# Patient Record
Sex: Male | Born: 1985 | Race: White | Hispanic: No | Marital: Single | Smoking: Never smoker
Health system: Southern US, Community
[De-identification: ages and names within clinical notes are randomized; demographics above are authoritative.]

## PROBLEM LIST (undated history)

## (undated) DIAGNOSIS — S42309A Unspecified fracture of shaft of humerus, unspecified arm, initial encounter for closed fracture: Secondary | ICD-10-CM

## (undated) HISTORY — PX: ORIF ANKLE FRACTURE: SUR919

## (undated) HISTORY — PX: ANKLE SURGERY: SHX546

## (undated) HISTORY — PX: FACIAL FRACTURE SURGERY: SHX1570

## (undated) HISTORY — PX: FOREARM FRACTURE SURGERY: SHX649

## (undated) HISTORY — PX: HAND SURGERY: SHX662

---

## 2006-06-10 ENCOUNTER — Emergency Department (HOSPITAL_COMMUNITY): Admission: EM | Admit: 2006-06-10 | Discharge: 2006-06-10 | Payer: Self-pay | Admitting: Emergency Medicine

## 2007-09-07 ENCOUNTER — Emergency Department (HOSPITAL_COMMUNITY): Admission: EM | Admit: 2007-09-07 | Discharge: 2007-09-08 | Payer: Self-pay | Admitting: Emergency Medicine

## 2010-11-14 ENCOUNTER — Ambulatory Visit (HOSPITAL_COMMUNITY)
Admission: RE | Admit: 2010-11-14 | Discharge: 2010-11-14 | Disposition: A | Discharge status: Home / Self Care | Payer: Private Health Insurance - Indemnity | Source: Ambulatory Visit | Attending: Orthopedic Surgery | Admitting: Orthopedic Surgery

## 2010-11-14 ENCOUNTER — Other Ambulatory Visit (HOSPITAL_COMMUNITY): Payer: Self-pay | Admitting: Orthopedic Surgery

## 2010-11-14 DIAGNOSIS — S2239XA Fracture of one rib, unspecified side, initial encounter for closed fracture: Secondary | ICD-10-CM | POA: Insufficient documentation

## 2010-11-14 DIAGNOSIS — T148XXA Other injury of unspecified body region, initial encounter: Secondary | ICD-10-CM

## 2010-11-14 DIAGNOSIS — S42023A Displaced fracture of shaft of unspecified clavicle, initial encounter for closed fracture: Secondary | ICD-10-CM | POA: Insufficient documentation

## 2010-11-14 DIAGNOSIS — X58XXXA Exposure to other specified factors, initial encounter: Secondary | ICD-10-CM | POA: Insufficient documentation

## 2010-11-14 DIAGNOSIS — R0789 Other chest pain: Secondary | ICD-10-CM | POA: Insufficient documentation

## 2010-11-24 ENCOUNTER — Inpatient Hospital Stay (HOSPITAL_COMMUNITY): Payer: Managed Care, Other (non HMO)

## 2010-11-24 ENCOUNTER — Inpatient Hospital Stay (HOSPITAL_COMMUNITY)
Admission: AD | Admit: 2010-11-24 | Discharge: 2010-11-25 | DRG: 551 | Disposition: A | Discharge status: Home / Self Care | Payer: Managed Care, Other (non HMO) | Source: Ambulatory Visit | Attending: Internal Medicine | Admitting: Internal Medicine

## 2010-11-24 DIAGNOSIS — M5020 Other cervical disc displacement, unspecified cervical region: Principal | ICD-10-CM | POA: Diagnosis present

## 2010-11-24 DIAGNOSIS — Z885 Allergy status to narcotic agent status: Secondary | ICD-10-CM

## 2010-11-24 DIAGNOSIS — I10 Essential (primary) hypertension: Secondary | ICD-10-CM | POA: Diagnosis present

## 2010-11-24 DIAGNOSIS — IMO0001 Reserved for inherently not codable concepts without codable children: Secondary | ICD-10-CM

## 2010-11-24 DIAGNOSIS — J189 Pneumonia, unspecified organism: Secondary | ICD-10-CM | POA: Diagnosis present

## 2010-11-24 LAB — CARDIAC PANEL(CRET KIN+CKTOT+MB+TROPI)
Relative Index: 0.8 (ref 0.0–2.5)
Troponin I: <0.3 ng/mL (ref <0.30)
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
## 2013-02-09 ENCOUNTER — Emergency Department (HOSPITAL_BASED_OUTPATIENT_CLINIC_OR_DEPARTMENT_OTHER): Payer: Managed Care, Other (non HMO)

## 2013-02-09 ENCOUNTER — Emergency Department (HOSPITAL_BASED_OUTPATIENT_CLINIC_OR_DEPARTMENT_OTHER)
Admission: EM | Admit: 2013-02-09 | Discharge: 2013-02-10 | Disposition: A | Discharge status: Home / Self Care | Payer: Managed Care, Other (non HMO) | Attending: Emergency Medicine | Admitting: Emergency Medicine

## 2013-02-09 ENCOUNTER — Encounter (HOSPITAL_BASED_OUTPATIENT_CLINIC_OR_DEPARTMENT_OTHER): Payer: Self-pay | Admitting: Emergency Medicine

## 2013-02-09 DIAGNOSIS — S52209A Unspecified fracture of shaft of unspecified ulna, initial encounter for closed fracture: Secondary | ICD-10-CM | POA: Insufficient documentation

## 2013-02-09 DIAGNOSIS — R296 Repeated falls: Secondary | ICD-10-CM | POA: Insufficient documentation

## 2013-02-09 DIAGNOSIS — Z8781 Personal history of (healed) traumatic fracture: Secondary | ICD-10-CM | POA: Insufficient documentation

## 2013-02-09 DIAGNOSIS — S52202A Unspecified fracture of shaft of left ulna, initial encounter for closed fracture: Secondary | ICD-10-CM

## 2013-02-09 DIAGNOSIS — Y9389 Activity, other specified: Secondary | ICD-10-CM | POA: Insufficient documentation

## 2013-02-09 DIAGNOSIS — Y9289 Other specified places as the place of occurrence of the external cause: Secondary | ICD-10-CM | POA: Insufficient documentation

## 2013-02-09 HISTORY — DX: Unspecified fracture of shaft of humerus, unspecified arm, initial encounter for closed fracture: S42.309A

## 2013-02-09 MED ORDER — IBUPROFEN 800 MG PO TABS
800.0000 mg | ORAL_TABLET | Freq: Once | ORAL | Status: AC
  Administered 2013-02-09: 800 mg via ORAL
  Filled 2013-02-09: qty 1

## 2013-02-09 MED ORDER — OXYCODONE-ACETAMINOPHEN 5-325 MG PO TABS
1.0000 | ORAL_TABLET | Freq: Once | ORAL | Status: AC
  Administered 2013-02-09: 1 via ORAL
  Filled 2013-02-09 (×2): qty 1

## 2013-02-09 NOTE — ED Notes (Signed)
I began splinting procedure per Dr. Nicanor Alcon request. Patient is hesitant to let me proceed due to pain and inability to straighten fingers. Dr. Algis Downs me to let patient medication take effect before proceeding.

## 2013-02-09 NOTE — ED Notes (Signed)
Pt fell on left arm.  Pt c/o left forearm and wrist pain.  Pulses palpable.
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
## 2013-02-10 ENCOUNTER — Encounter (HOSPITAL_BASED_OUTPATIENT_CLINIC_OR_DEPARTMENT_OTHER)
Admission: EM | Disposition: A | Discharge status: Home / Self Care | Payer: Self-pay | Source: Home / Self Care | Attending: Emergency Medicine

## 2013-02-10 ENCOUNTER — Encounter (HOSPITAL_COMMUNITY): Payer: Self-pay | Admitting: *Deleted

## 2013-02-10 ENCOUNTER — Emergency Department (HOSPITAL_COMMUNITY)
Admission: EM | Admit: 2013-02-10 | Discharge: 2013-02-10 | Disposition: A | Discharge status: Home / Self Care | Payer: Managed Care, Other (non HMO) | Source: Home / Self Care

## 2013-02-10 DIAGNOSIS — S63006A Unspecified dislocation of unspecified wrist and hand, initial encounter: Secondary | ICD-10-CM | POA: Insufficient documentation

## 2013-02-10 DIAGNOSIS — X58XXXA Exposure to other specified factors, initial encounter: Secondary | ICD-10-CM | POA: Insufficient documentation

## 2013-02-10 DIAGNOSIS — Z8781 Personal history of (healed) traumatic fracture: Secondary | ICD-10-CM | POA: Insufficient documentation

## 2013-02-10 DIAGNOSIS — Y929 Unspecified place or not applicable: Secondary | ICD-10-CM | POA: Insufficient documentation

## 2013-02-10 DIAGNOSIS — Z9889 Other specified postprocedural states: Secondary | ICD-10-CM | POA: Insufficient documentation

## 2013-02-10 DIAGNOSIS — Y939 Activity, unspecified: Secondary | ICD-10-CM | POA: Insufficient documentation

## 2013-02-10 SURGERY — CANCELLED PROCEDURE
Laterality: Left

## 2013-02-10 MED ORDER — CLINDAMYCIN PHOSPHATE 600 MG/50ML IV SOLN: 600.0000 mg | Freq: Once | INTRAVENOUS | Status: AC

## 2013-02-10 MED ORDER — OXYCODONE-ACETAMINOPHEN 5-325 MG PO TABS: 1.0000 | ORAL_TABLET | Freq: Four times a day (QID) | ORAL | Status: DC | PRN

## 2013-02-10 MED ORDER — IBUPROFEN 800 MG PO TABS: 800.0000 mg | ORAL_TABLET | Freq: Three times a day (TID) | ORAL | Status: DC

## 2013-02-10 SURGICAL SUPPLY — 38 items
BANDAGE ELASTIC 3 VELCRO ST LF (GAUZE/BANDAGES/DRESSINGS) ×3 IMPLANT
BANDAGE ELASTIC 4 VELCRO ST LF (GAUZE/BANDAGES/DRESSINGS) ×3 IMPLANT
BANDAGE GAUZE ELAST BULKY 4 IN (GAUZE/BANDAGES/DRESSINGS) ×3 IMPLANT
BLADE SURG ROTATE 9660 (MISCELLANEOUS) IMPLANT
BNDG CMPR 9X4 STRL LF SNTH (GAUZE/BANDAGES/DRESSINGS) ×2
BNDG ESMARK 4X9 LF (GAUZE/BANDAGES/DRESSINGS) ×3 IMPLANT
CLOTH BEACON ORANGE TIMEOUT ST (SAFETY) ×3 IMPLANT
CORDS BIPOLAR (ELECTRODE) ×3 IMPLANT
COVER SURGICAL LIGHT HANDLE (MISCELLANEOUS) ×3 IMPLANT
CUFF TOURNIQUET SINGLE 18IN (TOURNIQUET CUFF) ×3 IMPLANT
CUFF TOURNIQUET SINGLE 24IN (TOURNIQUET CUFF) IMPLANT
DECANTER SPIKE VIAL GLASS SM (MISCELLANEOUS) IMPLANT
DRAPE OEC MINIVIEW 54X84 (DRAPES) IMPLANT
DRAPE U-SHAPE 47X51 STRL (DRAPES) ×3 IMPLANT
GAUZE XEROFORM 1X8 LF (GAUZE/BANDAGES/DRESSINGS) ×3 IMPLANT
GLOVE ORTHO TXT STRL SZ7.5 (GLOVE) ×3 IMPLANT
GLOVE SS BIOGEL STRL SZ 8 (GLOVE) ×2 IMPLANT
GLOVE SUPERSENSE BIOGEL SZ 8 (GLOVE) ×1
GOWN PREVENTION PLUS XLARGE (GOWN DISPOSABLE) ×3 IMPLANT
GOWN STRL NON-REIN LRG LVL3 (GOWN DISPOSABLE) ×9 IMPLANT
GOWN STRL REIN XL XLG (GOWN DISPOSABLE) ×6 IMPLANT
KIT BASIN OR (CUSTOM PROCEDURE TRAY) ×3 IMPLANT
KIT ROOM TURNOVER OR (KITS) ×3 IMPLANT
LOOP VESSEL MAXI BLUE (MISCELLANEOUS) IMPLANT
MANIFOLD NEPTUNE II (INSTRUMENTS) ×3 IMPLANT
NEEDLE 22X1 1/2 (OR ONLY) (NEEDLE) IMPLANT
NS IRRIG 1000ML POUR BTL (IV SOLUTION) ×3 IMPLANT
PACK ORTHO EXTREMITY (CUSTOM PROCEDURE TRAY) ×3 IMPLANT
PAD ARMBOARD 7.5X6 YLW CONV (MISCELLANEOUS) ×6 IMPLANT
PAD CAST 4YDX4 CTTN HI CHSV (CAST SUPPLIES) ×2 IMPLANT
PADDING CAST COTTON 4X4 STRL (CAST SUPPLIES) ×2
SYR CONTROL 10ML LL (SYRINGE) IMPLANT
TOWEL OR 17X24 6PK STRL BLUE (TOWEL DISPOSABLE) ×3 IMPLANT
TOWEL OR 17X26 10 PK STRL BLUE (TOWEL DISPOSABLE) ×3 IMPLANT
TUBE CONNECTING 12X1/4 (SUCTIONS) ×3 IMPLANT
TUBE EVACUATION TLS (MISCELLANEOUS) ×3 IMPLANT
UNDERPAD 30X30 INCONTINENT (UNDERPADS AND DIAPERS) ×3 IMPLANT
WATER STERILE IRR 1000ML POUR (IV SOLUTION) ×3 IMPLANT

## 2013-02-10 NOTE — ED Notes (Signed)
I completed volar type splint with fiberglass material. Per Dr. Nicanor Alcon, I kept splint material to ulnar aspect of hand and forearm and helped patient slightly move clinched fingers to a more natural cusp. I first applied sock material and padded hand, wrist, and forearm with cotton webril.,I then applied fiberglass, and covered with light wrap of kerlix, then secured all with Ace. I applied and adjusted arm sling and checked capillary refill before and after procedure. Patient demonstrated independent finger movement and stated less pain, that he feels much better.

## 2013-02-10 NOTE — ED Notes (Addendum)
OR MD Gramig number [phone]

## 2013-02-10 NOTE — ED Notes (Signed)
Arm remains in splint and sling. Splint and sling CDI. Cap refill <2sec. Fingers pink and warm. Able to move fingers. Denies pain. Father at [facility]. Pt reports "ready to go", "will f/u with my ortho had surgeon that I have used in the past, Dr. Mina Marble", alert, NAD, calm, interactive, resps e/u, speaking in clear complete sentences. Ambulatory to d/c desk with father, steady gait. Has Rx in hand given to him by [facility]. Denies needs, sx, questions or concerns unmet.
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
## 2013-02-10 NOTE — ED Notes (Signed)
MD at bedside. 

## 2013-02-10 NOTE — Consult Note (Signed)
Reason for Consult: Left wrist lunate dislocation status post injury Referring Physician: [facility] emergency room  Erik [name] is an 27 y.o. male.  HPI: 27 year old male who presents with a left acute lunate fracture about the upper extremity after a injury to night. I discussed his care with  [facility] who refered him to [facility] main. The patient has a acute lunate dislocation as well as history of thumb surgery by Dr. Mina Marble. He has a bowing deformity about his forearm which is old. The patient unfortunately has marked lunate and displacement and disarray of the soft tissues. I discussed with the patient I would recommend surgical avenues of care. He states he is sensate in the hand. The hand does have refill.  He denies neck back chest or abdominal pain he denies instability symptoms in the past. He denies lower extremity pain complaints. He is not had anything to drink in the form of alcohol. He is here with his followup.  Past Medical History  Diagnosis Date  . Arm fracture     Past Surgical History  Procedure Laterality Date  . Ankle surgery    . Facial fracture surgery    . Forearm fracture surgery    . Hand surgery      No family history on file.  Social History:  reports that he has never smoked. He does not have any smokeless tobacco history on file. He reports that he does not drink alcohol. His drug history is not on file.  Allergies:  Allergies  Allergen Reactions  . Codeine Other (See Comments)    Hypersensitive    Medications: I have reviewed the patient's current medications.  No results found for this or any previous visit (from the past 48 hour(s)).  Dg Forearm Left  02/09/2013   *RADIOLOGY REPORT*  Clinical Data: Fall with injury to the left forearm.  Pain distally.  Unable to move fingers.  LEFT FOREARM - 2 VIEW  Comparison: None.  Findings: The left radius and ulna appear intact with mild bowing of the ulna.  No evidence of  acute fracture or subluxation.  No focal bone lesion or bone destruction.  No radiopaque soft tissue foreign bodies.  See additional report of the left wrist obtained on the same date.  IMPRESSION: Mild bowing deformity of the left ulna.  No acute fractures demonstrated in the left radius or ulna.   Original Report Authenticated By: Burman Nieves, M.D.   Dg Hand Complete Left  02/09/2013   *RADIOLOGY REPORT*  Clinical Data: Left arm injury with difficulty moving fingers.  LEFT HAND - COMPLETE 3+ VIEW  Comparison: None.  Findings: Lunate dislocation with anterior displacement and rotation of the lunate.  Degenerative changes in all postoperative changes in the first metacarpal phalangeal joint. No acute fractures demonstrated.  IMPRESSION: Lunate dislocation.   Original Report Authenticated By: Burman Nieves, M.D.    Review of Systems  HENT: Negative.   Eyes: Negative.   Gastrointestinal: Negative.   Genitourinary: Negative.   Skin: Negative.   Neurological: Negative.   Endo/Heme/Allergies: Negative.    Blood pressure 150/83, pulse 85, temperature 98.1 F (36.7 C), resp. rate 18, SpO2 100.00%. Physical Exam White male alert oriented and with his father. The a patient has refill to the hand his fingers are pink and warm. He is sensate. He has difficulty with flexion and extension and no evidence of tendon rupture. The patient I reviewed this at length. I reviewed his x-rays and  detailed show a lunate dislocation.  The remaining portion of his exam is benign. Elbow is stable shoulder stable neck and back are nontender.  Marland Kitchen.The patient is alert and oriented in no acute distress the patient complains of pain in the affected upper extremity.  The patient is noted to have a normal HEENT exam.  Lung fields show equal chest expansion and no shortness of breath  abdomen exam is nontender without distention.  Lower extremity examination does not show any fracture dislocation or blood clot  symptoms.  Pelvis is stable neck and back are stable and nontender Assessment/Plan: Acute left wrist lunate dislocation with disarray of the ligaments. This patient has a history of thumb reconstruction and a old deformity of his forearm about left upper extremity. I would recommend immediate lunate open relocation and carpal tunnel release with repair of the ligaments. The patient understands this. I verbalize my recommendations multiple times. At present time the patient is adamant that he is going to go home good bed. He is with his father he is alert he is oriented and is of sound judgment in terms of his ability to make a decision. Thus we will honor  his request. This is certainly against my advice as I feel at time delay will ultimately cause further problems. I made is well known to he and his father at bedside.   Will be happy engage surgical management of his upper extremity predicament he said desires. I implored him to please return emergency room or contact us immediately if he worsens. He states he may ultimately try to contact her physicians.   Erik [name],[place] Merry M 02/10/2013, 2:40 AM

## 2013-02-10 NOTE — ED Notes (Addendum)
Pt with ice pack and left arm elevated as requested by the physician.

## 2013-02-10 NOTE — ED Notes (Signed)
Pt seen by Dr. Amanda Pea Waynard Reeds), MD has seen & dispositioned pt prior to RN assessment. Orders received and initiated.

## 2014-06-18 ENCOUNTER — Encounter (HOSPITAL_COMMUNITY): Payer: Self-pay | Admitting: Orthopedic Surgery

## 2021-03-23 ENCOUNTER — Emergency Department (HOSPITAL_BASED_OUTPATIENT_CLINIC_OR_DEPARTMENT_OTHER): Payer: Managed Care, Other (non HMO) | Admitting: Radiology

## 2021-03-23 ENCOUNTER — Other Ambulatory Visit: Payer: Self-pay

## 2021-03-23 ENCOUNTER — Encounter (HOSPITAL_BASED_OUTPATIENT_CLINIC_OR_DEPARTMENT_OTHER): Payer: Self-pay | Admitting: Emergency Medicine

## 2021-03-23 ENCOUNTER — Emergency Department (HOSPITAL_BASED_OUTPATIENT_CLINIC_OR_DEPARTMENT_OTHER)
Admission: EM | Admit: 2021-03-23 | Discharge: 2021-03-23 | Disposition: A | Discharge status: Home / Self Care | Payer: Managed Care, Other (non HMO) | Attending: Emergency Medicine | Admitting: Emergency Medicine

## 2021-03-23 DIAGNOSIS — Y92410 Unspecified street and highway as the place of occurrence of the external cause: Secondary | ICD-10-CM | POA: Diagnosis not present

## 2021-03-23 DIAGNOSIS — S4991XA Unspecified injury of right shoulder and upper arm, initial encounter: Secondary | ICD-10-CM | POA: Diagnosis present

## 2021-03-23 MED ORDER — IBUPROFEN 800 MG PO TABS: 800.0000 mg | ORAL_TABLET | Freq: Three times a day (TID) | ORAL | 0 refills | Status: DC

## 2021-03-23 MED ORDER — IBUPROFEN 800 MG PO TABS
800.0000 mg | ORAL_TABLET | Freq: Once | ORAL | Status: AC
  Administered 2021-03-23: 800 mg via ORAL
  Filled 2021-03-23: qty 1
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
## 2021-03-23 NOTE — Discharge Instructions (Addendum)
Call Dr. Guilford Shi office, he can see you tomorrow.  Make sure you make the appointment when you first wake up in the morning.  In the meantime, take the ibuprofen 3 times daily.  Do not exceed 2400 daily.  Return if things change or worsen.  Keep your arm immobilized in the interim.  You have a Triceps avulsion fracture off of the olecranon. Adjacent soft tissue  swelling along the posterior elbow.
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
## 2021-03-25 ENCOUNTER — Encounter (HOSPITAL_BASED_OUTPATIENT_CLINIC_OR_DEPARTMENT_OTHER): Payer: Self-pay | Admitting: Emergency Medicine

## 2021-03-25 ENCOUNTER — Other Ambulatory Visit: Payer: Self-pay

## 2021-03-25 ENCOUNTER — Emergency Department (HOSPITAL_BASED_OUTPATIENT_CLINIC_OR_DEPARTMENT_OTHER)
Admission: EM | Admit: 2021-03-25 | Discharge: 2021-03-25 | Disposition: A | Discharge status: Home / Self Care | Payer: Managed Care, Other (non HMO) | Attending: Emergency Medicine | Admitting: Emergency Medicine

## 2021-03-25 DIAGNOSIS — Z4689 Encounter for fitting and adjustment of other specified devices: Secondary | ICD-10-CM | POA: Diagnosis not present

## 2021-03-25 DIAGNOSIS — Z4789 Encounter for other orthopedic aftercare: Secondary | ICD-10-CM

## 2021-03-25 NOTE — ED Provider Notes (Signed)
[facility] EMERGENCY DEPT Provider Note   CSN: [id number] Arrival date & time: 03/25/21  1455     History Chief Complaint  Patient presents with   Cast Problem    Erik [name] is a 35 y.o. male.  HPI Patient presents with problems with his cast.  Recent elbow fracture.  Seen at Ortho yesterday and had new splint placed.  States that the fiberglass is rubbing against his skin down by his hand.  States he has been able to get supplies to get it better by himself.  Mild pain in the elbow but unchanged.  No bleeding.    Past Medical History:  Diagnosis Date   Arm fracture     There are no problems to display for this patient.   Past Surgical History:  Procedure Laterality Date   ANKLE SURGERY     FACIAL FRACTURE SURGERY     FOREARM FRACTURE SURGERY     HAND SURGERY     ORIF ANKLE FRACTURE Left        No family history on file.  Social History   Tobacco Use   Smoking status: Never  Substance Use Topics   Alcohol use: No    Home Medications Prior to Admission medications   Medication Sig Start Date End Date Taking? Authorizing Provider  ibuprofen (ADVIL) 800 MG tablet Take 1 tablet (800 mg total) by mouth 3 (three) times daily. 03/23/21   Theron Arista, PA-C  oxyCODONE-acetaminophen (PERCOCET) 5-325 MG per tablet Take 1 tablet by mouth every 6 (six) hours as needed for pain. 02/10/13   Palumbo, April, MD    Allergies    Codeine  Review of Systems   Review of Systems  Constitutional:  Negative for fever.  Musculoskeletal:        Right elbow pain.  Neurological:  Negative for weakness and numbness.   Physical Exam Updated Vital Signs BP 128/74 (BP Location: Left Arm)   Pulse 87   Temp 97.8 F (36.6 C) (Tympanic)   Resp 14   SpO2 100%   Physical Exam Vitals and nursing note reviewed.  Musculoskeletal:     Comments: Splint on left upper extremity.  Now already padded by the time I saw him.  No skin breakdown.  Neurovascular intact in  hand.  Skin:    Capillary Refill: Capillary refill takes less than 2 seconds.  Neurological:     General: No focal deficit present.     Mental Status: He is alert.    ED Results / Procedures / Treatments   Labs (all labs ordered are listed, but only abnormal results are displayed) Labs Reviewed - No data to display  EKG None  Radiology No results found.  Procedures Procedures   Medications Ordered in ED Medications - No data to display  ED Course  I have reviewed the triage vital signs and the nursing notes.  Pertinent labs & imaging results that were available during my care of the patient were reviewed by me and considered in my medical decision making (see chart for details).    MDM Rules/Calculators/A&P                          Patient came for adjustment of splint.  More padding added.  Feels better.  Do not feels we need new imaging.  Discharge home Final Clinical Impression(s) / ED Diagnoses Final diagnoses:  Problem with fiberglass cast    Rx / DC Orders ED Discharge  Orders     None        Benjiman Core, MD 03/25/21 [phone]

## 2021-03-25 NOTE — ED Triage Notes (Signed)
Pt had right arm re-wrapped with new casting materials by surgeon yesterday and no underlayer was used. Cast is rubbing against pts skin and causing pain.

## 2021-03-28 ENCOUNTER — Other Ambulatory Visit: Payer: Self-pay | Admitting: Orthopedic Surgery

## 2021-03-28 DIAGNOSIS — S46311A Strain of muscle, fascia and tendon of triceps, right arm, initial encounter: Secondary | ICD-10-CM

## 2021-03-30 ENCOUNTER — Other Ambulatory Visit: Payer: Private Health Insurance - Indemnity

## 2021-04-01 ENCOUNTER — Other Ambulatory Visit: Payer: Self-pay | Admitting: Orthopaedic Surgery

## 2021-04-01 ENCOUNTER — Other Ambulatory Visit (HOSPITAL_COMMUNITY): Payer: Self-pay | Admitting: Orthopaedic Surgery

## 2021-04-01 DIAGNOSIS — S42441A Displaced fracture (avulsion) of medial epicondyle of right humerus, initial encounter for closed fracture: Secondary | ICD-10-CM

## 2021-04-01 DIAGNOSIS — T148XXA Other injury of unspecified body region, initial encounter: Secondary | ICD-10-CM

## 2021-04-04 ENCOUNTER — Ambulatory Visit (HOSPITAL_COMMUNITY): Payer: Managed Care, Other (non HMO)

## 2021-04-04 ENCOUNTER — Encounter (HOSPITAL_COMMUNITY): Payer: Self-pay

## 2021-04-06 ENCOUNTER — Ambulatory Visit
Admission: RE | Admit: 2021-04-06 | Discharge: 2021-04-06 | Disposition: A | Discharge status: Home / Self Care | Payer: Managed Care, Other (non HMO) | Source: Ambulatory Visit | Attending: Orthopedic Surgery | Admitting: Orthopedic Surgery

## 2021-04-06 ENCOUNTER — Other Ambulatory Visit: Payer: Self-pay

## 2021-04-06 DIAGNOSIS — S46311A Strain of muscle, fascia and tendon of triceps, right arm, initial encounter: Secondary | ICD-10-CM

## 2021-04-10 ENCOUNTER — Other Ambulatory Visit: Payer: Private Health Insurance - Indemnity

## 2021-04-15 ENCOUNTER — Other Ambulatory Visit: Payer: Self-pay

## 2021-04-15 ENCOUNTER — Encounter (HOSPITAL_BASED_OUTPATIENT_CLINIC_OR_DEPARTMENT_OTHER): Payer: Self-pay | Admitting: Orthopaedic Surgery

## 2021-04-18 ENCOUNTER — Other Ambulatory Visit: Payer: Managed Care, Other (non HMO)
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
## 2021-04-20 NOTE — Discharge Instructions (Addendum)
Ramond Marrow MD, MPH Alfonse Alpers, PA-C [facility] [address] [phone] (tel)   [phone] (fax)   POST-OPERATIVE INSTRUCTIONS   WOUND CARE Please keep splint clean dry and intact until followup.  You may shower on Post-Op Day #3.  You must keep splint dry during this process and may find that a plastic bag taped around the extremity or alternatively a towel based bath may be a better option.   If you get your splint wet or if it is damaged please contact our clinic.  EXERCISES Due to your splint being in place you will not be able to bear weight through your extremity.    You may use a sling for comfort. Please continue to work on range of motion of your fingers and stretch these multiple times a day to prevent stiffness. Please continue to ambulate and do not stay sitting or lying for too long. Perform foot and wrist pumps to assist in circulation.  FOLLOW-UP If you develop a Fever (>101.5), Redness or Drainage from the surgical incision site, please call our office to arrange for an evaluation. Please call the office to schedule a follow-up appointment for your incision check if you do not already have one, 7-10 days post-operatively.  REGIONAL ANESTHESIA (NERVE BLOCKS) The anesthesia team may have performed a nerve block for you if safe in the setting of your care.  This is a great tool used to minimize pain.  Typically the block may start wearing off overnight but the long acting medicine may last for 3-4 days.  The nerve block wearing off can be a challenging period but please utilize your as needed pain medications to try and manage this period.    POST-OP MEDICATIONS- Multimodal approach to pain control  In general your pain will be controlled with a combination of substances.  Prescriptions unless otherwise discussed are electronically sent to your pharmacy.  This is a carefully made plan we use to minimize narcotic use.       - Celebrex - Anti-inflammatory medication taken on a scheduled basis  - Acetaminophen - Non-narcotic pain medicine taken on a scheduled basis   - Oxycodone - This is a strong narcotic, to be used only on an "as needed" basis for SEVERE pain.             -           Zofran - take as needed for nausea   HELPFUL INFORMATION   If you had a block, it will wear off between 8-24 hrs postop typically.  This is period when your pain may go from nearly zero to the pain you would have had postop without the block.  This is an abrupt transition but nothing dangerous is happening.  You may take an extra dose of narcotic when this happens.   You may be more comfortable sleeping in a semi-seated position the first few nights following surgery.  Keep a pillow propped under the elbow and forearm for comfort.  If you have a recliner type of chair it might be beneficial.  If not that is fine too, but it would be helpful to sleep propped up with pillows behind your operated shoulder as well under your elbow and forearm.  This will reduce pulling on the suture lines.   When dressing, put your operative arm in the sleeve first.  When getting undressed, take your operative arm out last.  Loose fitting, button-down shirts are recommended.  Often  in the first days after surgery you may be more comfortable keeping your operative arm under your shirt and not through the sleeve.   You may return to work/school in the next couple of days when you feel up to it.  Desk work and typing in the sling is fine.   We suggest you use the pain medication the first night prior to going to bed, in order to ease any pain when the anesthesia wears off. You should avoid taking pain medications on an empty stomach as it will make you nauseous.   You should wean off your narcotic medicines as soon as you are able.  Most patients will be off or using minimal narcotics before their first postop appointment.    Do not drink alcoholic  beverages or take illicit drugs when taking pain medications.   It is against the law to drive while taking narcotics.  In some states it is against the law to drive while your arm is in a sling.    Pain medication may make you constipated.  Below are a few solutions to try in this order:   - Decrease the amount of pain medication if you aren't having pain.   - Drink lots of decaffeinated fluids.   - Drink prune juice and/or each dried prunes   If the first 3 don't work start with additional solutions   - Take Colace - an over-the-counter stool softener   - Take Senokot - an over-the-counter laxative   - Take Miralax - a stronger over-the-counter laxative   For more information including helpful videos and documents visit our website:   [email or web address]    Post Anesthesia Home Care Instructions  Activity: Get plenty of rest for the remainder of the day. A responsible individual must stay with you for 24 hours following the procedure.  For the next 24 hours, DO NOT: -Drive a car -Advertising copywriter -Drink alcoholic beverages -Take any medication unless instructed by your physician -Make any legal decisions or sign important papers.  Meals: Start with liquid foods such as gelatin or soup. Progress to regular foods as tolerated. Avoid greasy, spicy, heavy foods. If nausea and/or vomiting occur, drink only clear liquids until the nausea and/or vomiting subsides. Call your physician if vomiting continues.  Special Instructions/Symptoms: Your throat may feel dry or sore from the anesthesia or the breathing tube placed in your throat during surgery. If this causes discomfort, gargle with warm salt water. The discomfort should disappear within 24 hours.  If you had a scopolamine patch placed behind your ear for the management of post- operative nausea and/or vomiting:  1. The medication in the patch is effective for 72 hours, after which it should be  removed.  Wrap patch in a tissue and discard in the trash. Wash hands thoroughly with soap and water. 2. You may remove the patch earlier than 72 hours if you experience unpleasant side effects which may include dry mouth, dizziness or visual disturbances. 3. Avoid touching the patch. Wash your hands with soap and water after contact with the patch.

## 2021-04-21 ENCOUNTER — Ambulatory Visit (HOSPITAL_BASED_OUTPATIENT_CLINIC_OR_DEPARTMENT_OTHER): Payer: Managed Care, Other (non HMO) | Admitting: Certified Registered"

## 2021-04-21 ENCOUNTER — Ambulatory Visit (HOSPITAL_BASED_OUTPATIENT_CLINIC_OR_DEPARTMENT_OTHER)
Admission: RE | Admit: 2021-04-21 | Discharge: 2021-04-21 | Disposition: A | Discharge status: Home / Self Care | Payer: Managed Care, Other (non HMO) | Source: Ambulatory Visit | Attending: Orthopaedic Surgery | Admitting: Orthopaedic Surgery

## 2021-04-21 ENCOUNTER — Encounter (HOSPITAL_BASED_OUTPATIENT_CLINIC_OR_DEPARTMENT_OTHER)
Admission: RE | Disposition: A | Discharge status: Home / Self Care | Payer: Self-pay | Source: Ambulatory Visit | Attending: Orthopaedic Surgery

## 2021-04-21 ENCOUNTER — Other Ambulatory Visit: Payer: Self-pay

## 2021-04-21 ENCOUNTER — Encounter (HOSPITAL_BASED_OUTPATIENT_CLINIC_OR_DEPARTMENT_OTHER): Payer: Self-pay | Admitting: Orthopaedic Surgery

## 2021-04-21 DIAGNOSIS — M7021 Olecranon bursitis, right elbow: Secondary | ICD-10-CM | POA: Insufficient documentation

## 2021-04-21 DIAGNOSIS — S46311A Strain of muscle, fascia and tendon of triceps, right arm, initial encounter: Secondary | ICD-10-CM | POA: Diagnosis present

## 2021-04-21 HISTORY — PX: TRICEPS TENDON REPAIR: SHX2577

## 2021-04-21 SURGERY — REPAIR, TENDON, TRICEPS
Anesthesia: General | Site: Elbow | Laterality: Right

## 2021-04-21 MED ORDER — PROPOFOL 10 MG/ML IV BOLUS
INTRAVENOUS | Status: AC
  Filled 2021-04-21: qty 20

## 2021-04-21 MED ORDER — MIDAZOLAM HCL 2 MG/2ML IJ SOLN
INTRAMUSCULAR | Status: AC
  Filled 2021-04-21: qty 2

## 2021-04-21 MED ORDER — LIDOCAINE HCL (CARDIAC) PF 100 MG/5ML IV SOSY
PREFILLED_SYRINGE | INTRAVENOUS | Status: DC | PRN
  Administered 2021-04-21: 60 mg via INTRAVENOUS

## 2021-04-21 MED ORDER — AMISULPRIDE (ANTIEMETIC) 5 MG/2ML IV SOLN: 10.0000 mg | Freq: Once | INTRAVENOUS | Status: DC | PRN

## 2021-04-21 MED ORDER — HYDROMORPHONE HCL 1 MG/ML IJ SOLN
INTRAMUSCULAR | Status: AC
  Filled 2021-04-21: qty 1

## 2021-04-21 MED ORDER — HYDROMORPHONE HCL 1 MG/ML IJ SOLN
INTRAMUSCULAR | Status: AC
  Filled 2021-04-21: qty 0.5

## 2021-04-21 MED ORDER — CEFAZOLIN SODIUM-DEXTROSE 2-4 GM/100ML-% IV SOLN
INTRAVENOUS | Status: AC
  Filled 2021-04-21: qty 100

## 2021-04-21 MED ORDER — ONDANSETRON HCL 4 MG/2ML IJ SOLN
INTRAMUSCULAR | Status: DC | PRN
  Administered 2021-04-21: 4 mg via INTRAVENOUS

## 2021-04-21 MED ORDER — BUPIVACAINE HCL (PF) 0.25 % IJ SOLN
INTRAMUSCULAR | Status: DC | PRN
  Administered 2021-04-21: 20 mL

## 2021-04-21 MED ORDER — PHENYLEPHRINE 40 MCG/ML (10ML) SYRINGE FOR IV PUSH (FOR BLOOD PRESSURE SUPPORT)
PREFILLED_SYRINGE | INTRAVENOUS | Status: AC
  Filled 2021-04-21: qty 10

## 2021-04-21 MED ORDER — OXYCODONE HCL 5 MG/5ML PO SOLN: 5.0000 mg | Freq: Once | ORAL | Status: DC | PRN

## 2021-04-21 MED ORDER — 0.9 % SODIUM CHLORIDE (POUR BTL) OPTIME
TOPICAL | Status: DC | PRN
  Administered 2021-04-21: 12:00:00 1000 mL

## 2021-04-21 MED ORDER — FENTANYL CITRATE (PF) 100 MCG/2ML IJ SOLN
INTRAMUSCULAR | Status: AC
  Filled 2021-04-21: qty 2

## 2021-04-21 MED ORDER — DEXAMETHASONE SODIUM PHOSPHATE 10 MG/ML IJ SOLN
INTRAMUSCULAR | Status: DC | PRN
  Administered 2021-04-21: 10 mg via INTRAVENOUS

## 2021-04-21 MED ORDER — LACTATED RINGERS IV SOLN: INTRAVENOUS | Status: DC

## 2021-04-21 MED ORDER — ONDANSETRON HCL 4 MG/2ML IJ SOLN
INTRAMUSCULAR | Status: AC
  Filled 2021-04-21: qty 2

## 2021-04-21 MED ORDER — PROPOFOL 10 MG/ML IV BOLUS
INTRAVENOUS | Status: DC | PRN
  Administered 2021-04-21: 100 mg via INTRAVENOUS
  Administered 2021-04-21: 200 mg via INTRAVENOUS
  Administered 2021-04-21: 100 mg via INTRAVENOUS

## 2021-04-21 MED ORDER — ONDANSETRON HCL 4 MG PO TABS: 4.0000 mg | ORAL_TABLET | Freq: Three times a day (TID) | ORAL | 0 refills | Status: AC | PRN

## 2021-04-21 MED ORDER — HYDROMORPHONE HCL 1 MG/ML IJ SOLN
0.2500 mg | INTRAMUSCULAR | Status: DC | PRN
Start: 2021-04-21 — End: 2021-04-21
  Administered 2021-04-21 (×2): 0.5 mg via INTRAVENOUS

## 2021-04-21 MED ORDER — VANCOMYCIN HCL 1000 MG IV SOLR
INTRAVENOUS | Status: DC | PRN
  Administered 2021-04-21: 1000 mg via TOPICAL

## 2021-04-21 MED ORDER — MIDAZOLAM HCL 5 MG/5ML IJ SOLN
INTRAMUSCULAR | Status: DC | PRN
  Administered 2021-04-21: 2 mg via INTRAVENOUS

## 2021-04-21 MED ORDER — PROMETHAZINE HCL 25 MG/ML IJ SOLN: 6.2500 mg | INTRAMUSCULAR | Status: DC | PRN

## 2021-04-21 MED ORDER — OXYCODONE HCL 5 MG PO TABS: ORAL_TABLET | ORAL | 0 refills | Status: AC

## 2021-04-21 MED ORDER — LIDOCAINE 2% (20 MG/ML) 5 ML SYRINGE
INTRAMUSCULAR | Status: AC
  Filled 2021-04-21: qty 5

## 2021-04-21 MED ORDER — CEFAZOLIN SODIUM-DEXTROSE 2-4 GM/100ML-% IV SOLN
2.0000 g | INTRAVENOUS | Status: AC
  Administered 2021-04-21: 12:00:00 2 g via INTRAVENOUS

## 2021-04-21 MED ORDER — PHENYLEPHRINE HCL (PRESSORS) 10 MG/ML IV SOLN
INTRAVENOUS | Status: DC | PRN
  Administered 2021-04-21: 40 ug via INTRAVENOUS

## 2021-04-21 MED ORDER — EPHEDRINE 5 MG/ML INJ
INTRAVENOUS | Status: AC
  Filled 2021-04-21: qty 10

## 2021-04-21 MED ORDER — FENTANYL CITRATE (PF) 100 MCG/2ML IJ SOLN
INTRAMUSCULAR | Status: DC | PRN
  Administered 2021-04-21: 50 ug via INTRAVENOUS

## 2021-04-21 MED ORDER — OXYCODONE HCL 5 MG PO TABS: 5.0000 mg | ORAL_TABLET | Freq: Once | ORAL | Status: DC | PRN

## 2021-04-21 MED ORDER — CELECOXIB 100 MG PO CAPS: 100.0000 mg | ORAL_CAPSULE | Freq: Two times a day (BID) | ORAL | 0 refills | Status: AC

## 2021-04-21 MED ORDER — EPHEDRINE SULFATE 50 MG/ML IJ SOLN
INTRAMUSCULAR | Status: DC | PRN
  Administered 2021-04-21 (×2): 5 mg via INTRAVENOUS
  Administered 2021-04-21: 10 mg via INTRAVENOUS
  Administered 2021-04-21: 5 mg via INTRAVENOUS

## 2021-04-21 MED ORDER — ACETAMINOPHEN 500 MG PO TABS: 1000.0000 mg | ORAL_TABLET | Freq: Three times a day (TID) | ORAL | 0 refills | Status: AC

## 2021-04-21 MED ORDER — MEPERIDINE HCL 25 MG/ML IJ SOLN: 6.2500 mg | INTRAMUSCULAR | Status: DC | PRN

## 2021-04-21 SURGICAL SUPPLY — 62 items
APL PRP STRL LF DISP 70% ISPRP (MISCELLANEOUS) ×1
BLADE HEX COATED 2.75 (ELECTRODE) IMPLANT
BLADE SURG 10 STRL SS (BLADE) ×4 IMPLANT
BLADE SURG 15 STRL LF DISP TIS (BLADE) ×1 IMPLANT
BLADE SURG 15 STRL SS (BLADE) ×2
BNDG ELASTIC 4X5.8 VLCR STR LF (GAUZE/BANDAGES/DRESSINGS) ×2 IMPLANT
CHLORAPREP W/TINT 26 (MISCELLANEOUS) ×2 IMPLANT
CLSR STERI-STRIP ANTIMIC 1/2X4 (GAUZE/BANDAGES/DRESSINGS) ×2 IMPLANT
CUFF TOURN SGL QUICK 18X3 (MISCELLANEOUS) ×2 IMPLANT
CUFF TOURN SGL QUICK 24 (TOURNIQUET CUFF)
CUFF TRNQT CYL 24X4X16.5-23 (TOURNIQUET CUFF) IMPLANT
DECANTER SPIKE VIAL GLASS SM (MISCELLANEOUS) IMPLANT
DRAPE EXTREMITY T 121X128X90 (DISPOSABLE) ×2 IMPLANT
DRAPE INCISE IOBAN 66X45 STRL (DRAPES) IMPLANT
DRAPE OEC MINIVIEW 54X84 (DRAPES) IMPLANT
DRAPE U-SHAPE 47X51 STRL (DRAPES) ×2 IMPLANT
ELECT REM PT RETURN 9FT ADLT (ELECTROSURGICAL) ×2
ELECTRODE REM PT RTRN 9FT ADLT (ELECTROSURGICAL) ×1 IMPLANT
GAUZE SPONGE 4X4 12PLY STRL (GAUZE/BANDAGES/DRESSINGS) ×2 IMPLANT
GLOVE SRG 8 PF TXTR STRL LF DI (GLOVE) ×1 IMPLANT
GLOVE SURG ENC MOIS LTX SZ6.5 (GLOVE) ×2 IMPLANT
GLOVE SURG LTX SZ8 (GLOVE) ×2 IMPLANT
GLOVE SURG UNDER POLY LF SZ6.5 (GLOVE) ×2 IMPLANT
GLOVE SURG UNDER POLY LF SZ8 (GLOVE) ×2
GOWN STRL REUS W/ TWL LRG LVL3 (GOWN DISPOSABLE) ×3 IMPLANT
GOWN STRL REUS W/TWL LRG LVL3 (GOWN DISPOSABLE) ×6
GOWN STRL REUS W/TWL XL LVL3 (GOWN DISPOSABLE) ×2 IMPLANT
IMP SYS 2ND FIX PEEK 4.75X19.1 (Miscellaneous) ×2 IMPLANT
IMPL SYS 2ND FX PEEK 4.75X19.1 (Miscellaneous) ×1 IMPLANT
KIT TRANSTIBIAL (DISPOSABLE) IMPLANT
NDL SUT 6 .5 CRC .975X.05 MAYO (NEEDLE) ×1 IMPLANT
NEEDLE MAYO TAPER (NEEDLE) ×2
NS IRRIG 1000ML POUR BTL (IV SOLUTION) ×2 IMPLANT
PACK ARTHROSCOPY DSU (CUSTOM PROCEDURE TRAY) ×2 IMPLANT
PACK BASIN DAY SURGERY FS (CUSTOM PROCEDURE TRAY) ×2 IMPLANT
PAD CAST 4YDX4 CTTN HI CHSV (CAST SUPPLIES) ×1 IMPLANT
PADDING CAST COTTON 4X4 STRL (CAST SUPPLIES) ×2
PENCIL SMOKE EVACUATOR (MISCELLANEOUS) ×2 IMPLANT
RETRIEVER SUT HEWSON (MISCELLANEOUS) IMPLANT
SHEET MEDIUM DRAPE 40X70 STRL (DRAPES) ×2 IMPLANT
SLEEVE SCD COMPRESS KNEE MED (STOCKING) ×2 IMPLANT
SLING ARM FOAM STRAP LRG (SOFTGOODS) IMPLANT
SLING ARM FOAM STRAP XLG (SOFTGOODS) ×2 IMPLANT
SPLINT FAST PLASTER 5X30 (CAST SUPPLIES) ×10
SPLINT PLASTER CAST FAST 5X30 (CAST SUPPLIES) ×10 IMPLANT
SPONGE T-LAP 18X18 ~~LOC~~+RFID (SPONGE) ×2 IMPLANT
STAPLER VISISTAT 35W (STAPLE) IMPLANT
SUCTION FRAZIER HANDLE 10FR (MISCELLANEOUS)
SUCTION TUBE FRAZIER 10FR DISP (MISCELLANEOUS) IMPLANT
SUT MNCRL AB 4-0 PS2 18 (SUTURE) ×2 IMPLANT
SUT VIC AB 0 CT1 27 (SUTURE) ×4
SUT VIC AB 0 CT1 27XBRD ANBCTR (SUTURE) ×2 IMPLANT
SUT VIC AB 1 CT1 27 (SUTURE)
SUT VIC AB 1 CT1 27XBRD ANBCTR (SUTURE) IMPLANT
SUT VIC AB 2-0 CT1 27 (SUTURE)
SUT VIC AB 2-0 CT1 TAPERPNT 27 (SUTURE) IMPLANT
SUT VIC AB 2-0 SH 27 (SUTURE) ×2
SUT VIC AB 2-0 SH 27XBRD (SUTURE) ×1 IMPLANT
SUT VIC AB 3-0 SH 27 (SUTURE)
SUT VIC AB 3-0 SH 27X BRD (SUTURE) IMPLANT
TOWEL GREEN STERILE FF (TOWEL DISPOSABLE) ×4 IMPLANT
TUBE SUCTION HIGH CAP CLEAR NV (SUCTIONS) ×2 IMPLANT

## 2021-04-21 NOTE — Anesthesia Preprocedure Evaluation (Signed)
Anesthesia Evaluation  Patient identified by MRN, date of birth, ID band Patient awake    Reviewed: Allergy & Precautions, H&P , NPO status , Patient's Chart, lab work & pertinent test results  Airway Mallampati: II  TM Distance: >3 FB Neck ROM: Full    Dental no notable dental hx.    Pulmonary neg pulmonary ROS,    Pulmonary exam normal breath sounds clear to auscultation       Cardiovascular negative cardio ROS Normal cardiovascular exam Rhythm:Regular Rate:Normal     Neuro/Psych negative neurological ROS  negative psych ROS   GI/Hepatic negative GI ROS, Neg liver ROS,   Endo/Other  negative endocrine ROS  Renal/GU negative Renal ROS  negative genitourinary   Musculoskeletal negative musculoskeletal ROS (+)   Abdominal (+) + obese,   Peds negative pediatric ROS (+)  Hematology negative hematology ROS (+)   Anesthesia Other Findings   Reproductive/Obstetrics negative OB ROS                             Anesthesia Physical Anesthesia Plan  ASA: 2  Anesthesia Plan: General   Post-op Pain Management:    Induction: Intravenous  PONV Risk Score and Plan: 2 and Ondansetron, Midazolam and Treatment may vary due to age or medical condition  Airway Management Planned: LMA  Additional Equipment:   Intra-op Plan:   Post-operative Plan: Extubation in OR  Informed Consent: I have reviewed the patients History and Physical, chart, labs and discussed the procedure including the risks, benefits and alternatives for the proposed anesthesia with the patient or authorized representative who has indicated his/her understanding and acceptance.     Dental advisory given  Plan Discussed with: CRNA  Anesthesia Plan Comments:         Anesthesia Quick Evaluation

## 2021-04-21 NOTE — Transfer of Care (Signed)
Immediate Anesthesia Transfer of Care Note  Patient: Erik [name]  Procedure(s) Performed: TRICEPS TENDON REPAIR (Right: Elbow)  Patient Location: PACU  Anesthesia Type:General  Level of Consciousness: drowsy  Airway & Oxygen Therapy: Patient Spontanous Breathing and Patient connected to face mask oxygen  Post-op Assessment: Report given to RN and Post -op Vital signs reviewed and stable  Post vital signs: Reviewed and stable  Last Vitals:  Vitals Value Taken Time  BP 133/95 04/21/21 1300  Temp    Pulse 81 04/21/21 1308  Resp 16 04/21/21 1308  SpO2 100 % 04/21/21 1308  Vitals shown include unvalidated device data.  Last Pain:  Vitals:   04/21/21 1052  TempSrc: Oral  PainSc: 0-No pain      Patients Stated Pain Goal: 6 (04/21/21 1052)  Complications: No notable events documented.

## 2021-04-21 NOTE — Anesthesia Postprocedure Evaluation (Signed)
Anesthesia Post Note  Patient: Erik [name]  Procedure(s) Performed: TRICEPS TENDON REPAIR (Right: Elbow)     Patient location during evaluation: PACU Anesthesia Type: General Level of consciousness: awake and alert Pain management: pain level controlled Vital Signs Assessment: post-procedure vital signs reviewed and stable Respiratory status: spontaneous breathing, nonlabored ventilation and respiratory function stable Cardiovascular status: blood pressure returned to baseline and stable Postop Assessment: no apparent nausea or vomiting Anesthetic complications: no   No notable events documented.  Last Vitals:  Vitals:   04/21/21 1330 04/21/21 1356  BP: 130/87 (!) 160/103  Pulse: 77 81  Resp: 13 16  Temp:  37 C  SpO2: 97% 98%    Last Pain:  Vitals:   04/21/21 1356  TempSrc:   PainSc: 4                  Lowella Curb

## 2021-04-21 NOTE — Anesthesia Procedure Notes (Signed)
Procedure Name: LMA Insertion Date/Time: 04/21/2021 11:37 AM Performed by: Lauralyn Primes, CRNA Pre-anesthesia Checklist: Patient identified, Emergency Drugs available, Suction available and Patient being monitored Patient Re-evaluated:Patient Re-evaluated prior to induction Oxygen Delivery Method: Circle system utilized Preoxygenation: Pre-oxygenation with 100% oxygen Induction Type: IV induction Ventilation: Mask ventilation without difficulty LMA: LMA inserted LMA Size: 4.0 Number of attempts: 1 Airway Equipment and Method: Bite block Placement Confirmation: positive ETCO2 Tube secured with: Tape Dental Injury: Teeth and Oropharynx as per pre-operative assessment

## 2021-04-21 NOTE — Interval H&P Note (Signed)
All questions answered, patient wants to proceed with procedure. ? ?

## 2021-04-22 ENCOUNTER — Encounter (HOSPITAL_BASED_OUTPATIENT_CLINIC_OR_DEPARTMENT_OTHER): Payer: Self-pay | Admitting: Orthopaedic Surgery

## 2021-04-22 NOTE — Op Note (Signed)
Orthopaedic Surgery Operative Note (CSN: [id number])  Erik [name]  1986/01/26 Date of Surgery: 04/21/2021   Diagnoses:  Right triceps rupture and olecranon bursitis  Procedure: Right distal triceps repair and an excision of enthesophyte Right olecranon bursectomy   Operative Finding Successful completion of the planned procedure.  Patient's triceps ruptured off the medial side about 50%.  He was quite retracted.  He enthesophyte is present was at least 4 cm proximal to the tip of the olecranon.  We are able to mobilize the tendon and repair with a single swivel lock anchor however we are not able to get it fully back down to bone in all forms.  Because of this we ended up tying the tendon to the remaining intact lateral tendon.  This both restored length while restoring footprint and the overall integrity of the muscle fibers and the medial olecranon.  We identified and protected the ulnar nerve throughout and ensured that it was free of tension.  There is a large olecranon bursitis and we resected that as well.  Post-operative plan: The patient will be in a splint for a week and progressed to a hinged brace.  Patient was easily stable to 90 degrees.  We will start his brace near that number or slightly below.  The patient will be discharged home.  DVT prophylaxis Aspirin 81 mg twice daily for 6 weeks.   Pain control with PRN pain medication preferring oral medicines.  Follow up plan will be scheduled in approximately 7 days for incision check and XR.  Post-Op Diagnosis: Same Surgeons:Primary: Bjorn Pippin, MD Assistants:Caroline McBane PA-C Location: [facility] OR ROOM 6 Anesthesia: General with local anesthesia Antibiotics: Ancef 2 g with local vancomycin powder 1 g at the surgical site Tourniquet time:  Total Tourniquet Time Documented: Upper Arm (Right) - 47 minutes Total: Upper Arm (Right) - 47 minutes  Estimated Blood Loss: Minimal Complications: None Specimens:  None Implants: Implant Name Type Inv. Item Serial No. Manufacturer Lot No. LRB No. Used Action  IMP SYS 2ND FIX PEEK 4.75X19.1 - [id number] Miscellaneous IMP SYS 2ND FIX PEEK 4.75X19.1  [employer] [id number] Right 1 Implanted    Indications for Surgery:   Erik [name] is a 35 y.o. male with partial triceps rupture identified on MRI and ultrasound.  Benefits and risks of operative and nonoperative management were discussed prior to surgery with patient/guardian(s) and informed consent form was completed.  Specific risks including infection, need for additional surgery, rerupture, stiffness, ulnar nerve symptoms amongst others.   Procedure:   The patient was identified properly. Informed consent was obtained and the surgical site was marked. The patient was taken up to suite where general anesthesia was induced.  The patient was positioned lateral on a beanbag with arm over a sure foot positioner.  The right elbow was prepped and draped in the usual sterile fashion.  Timeout was performed before the beginning of the case.  Tourniquet was used for the above duration.  Began a posterior approach to the elbow making a 7 cm incision.  With the skin sharp achieving hemostasis we progressed we initially encountered the olecranon bursa and we resected en bloc carefully protecting the skin.  At that point we went proximal identify the ruptured enthesophyte.  We excised that carefully preserving the fibers of the triceps.  That point we mobilized the medial head of the triceps which was quite retracted about 4 to 5 cm proximal to the tip of the olecranon.  Though we mobilized  carefully it was clear that it was going to be under significant tension of advanced all the way back down to the olecranon.  Based on this we felt that if we could partially attach it to the olecranon while providing a side-to-side repair would be more beneficial for the patient and doing a interposition graft currently.  We used two  #2 FiberWire's to perform a running Krakw locking stitch.  We obtained good fixation of the medial head of the triceps then used a single 4.75 mm swivel lock in the tip olecranon aiming away from the joint to restore the tendon to bone.  We performed a side-to-side repair using the safety stitch from the anchor further imbricating the tendon.  We had good range of motion in 90 degrees of flexion.  We irrigated the wound copiously before placing local antibiotic as listed above.  We closed the incision in a multilayer fashion with absorbable suture.  Sterile dressing was placed.  Well molded well-padded long-arm splint was placed.  Patient declined a sling will be provided 1 to the patient today to take home.  Patient was awoken taken to PACU in stable condition.  Alfonse Alpers, PA-C, present and scrubbed throughout the case, critical for completion in a timely fashion, and for retraction, instrumentation, closure.
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
